# Patient Record
Sex: Male | Born: 1985 | Race: White | Hispanic: No | Marital: Single | Smoking: Current every day smoker
Health system: Southern US, Community
[De-identification: ages and names within clinical notes are randomized; demographics above are authoritative.]

## PROBLEM LIST (undated history)

## (undated) DIAGNOSIS — I10 Essential (primary) hypertension: Secondary | ICD-10-CM

## (undated) DIAGNOSIS — E119 Type 2 diabetes mellitus without complications: Secondary | ICD-10-CM

## (undated) DIAGNOSIS — G629 Polyneuropathy, unspecified: Secondary | ICD-10-CM

## (undated) DIAGNOSIS — F419 Anxiety disorder, unspecified: Secondary | ICD-10-CM

## (undated) DIAGNOSIS — M549 Dorsalgia, unspecified: Secondary | ICD-10-CM

## (undated) HISTORY — DX: Essential (primary) hypertension: I10

## (undated) HISTORY — DX: Dorsalgia, unspecified: M54.9

## (undated) HISTORY — DX: Type 2 diabetes mellitus without complications: E11.9

## (undated) HISTORY — PX: SHOULDER SURGERY: SHX246

## (undated) HISTORY — DX: Polyneuropathy, unspecified: G62.9

## (undated) HISTORY — DX: Anxiety disorder, unspecified: F41.9

---

## 2008-02-13 ENCOUNTER — Emergency Department (HOSPITAL_COMMUNITY): Admission: EM | Admit: 2008-02-13 | Discharge: 2008-02-13 | Payer: Self-pay | Admitting: Emergency Medicine

## 2009-05-17 ENCOUNTER — Emergency Department (HOSPITAL_COMMUNITY): Admission: EM | Admit: 2009-05-17 | Discharge: 2009-05-17 | Payer: Self-pay | Admitting: Emergency Medicine

## 2010-06-03 LAB — MONONUCLEOSIS SCREEN: Mono Screen: NEGATIVE

## 2010-06-03 LAB — DIFFERENTIAL
Basophils Absolute: 0.1 10*3/uL (ref 0.0–0.1)
Eosinophils Relative: 1 % (ref 0–5)
Lymphocytes Relative: 14 % (ref 12–46)
Lymphs Abs: 2.1 10*3/uL (ref 0.7–4.0)
Neutro Abs: 11.4 10*3/uL — ABNORMAL HIGH (ref 1.7–7.7)
Neutrophils Relative %: 78 % — ABNORMAL HIGH (ref 43–77)

## 2010-06-03 LAB — CBC
HCT: 39.5 % (ref 39.0–52.0)
Platelets: 278 10*3/uL (ref 150–400)
RDW: 13.2 % (ref 11.5–15.5)
WBC: 14.6 10*3/uL — ABNORMAL HIGH (ref 4.0–10.5)

## 2010-06-03 LAB — RAPID STREP SCREEN (MED CTR MEBANE ONLY): Streptococcus, Group A Screen (Direct): NEGATIVE

## 2010-12-13 LAB — DIFFERENTIAL
Basophils Absolute: 0.1 10*3/uL (ref 0.0–0.1)
Basophils Relative: 1 % (ref 0–1)
Eosinophils Absolute: 0.2 10*3/uL (ref 0.0–0.7)
Monocytes Absolute: 0.6 10*3/uL (ref 0.1–1.0)
Monocytes Relative: 6 % (ref 3–12)
Neutrophils Relative %: 61 % (ref 43–77)

## 2010-12-13 LAB — URINALYSIS, ROUTINE W REFLEX MICROSCOPIC
Bilirubin Urine: NEGATIVE
Hgb urine dipstick: NEGATIVE
Nitrite: NEGATIVE
Protein, ur: NEGATIVE mg/dL
Urobilinogen, UA: 0.2 mg/dL (ref 0.0–1.0)

## 2010-12-13 LAB — COMPREHENSIVE METABOLIC PANEL
ALT: 21 U/L (ref 0–53)
Alkaline Phosphatase: 61 U/L (ref 39–117)
BUN: 9 mg/dL (ref 6–23)
Chloride: 108 mEq/L (ref 96–112)
Glucose, Bld: 99 mg/dL (ref 70–99)
Potassium: 4.4 mEq/L (ref 3.5–5.1)
Sodium: 139 mEq/L (ref 135–145)
Total Bilirubin: 1.6 mg/dL — ABNORMAL HIGH (ref 0.3–1.2)
Total Protein: 7 g/dL (ref 6.0–8.3)

## 2010-12-13 LAB — CBC
HCT: 40.5 % (ref 39.0–52.0)
Hemoglobin: 13.8 g/dL (ref 13.0–17.0)
RBC: 4.6 MIL/uL (ref 4.22–5.81)
RDW: 13.1 % (ref 11.5–15.5)
WBC: 9.7 10*3/uL (ref 4.0–10.5)

## 2019-11-14 ENCOUNTER — Emergency Department (HOSPITAL_COMMUNITY)
Admission: EM | Admit: 2019-11-14 | Discharge: 2019-11-14 | Disposition: A | Discharge status: Home / Self Care | Payer: Worker's Compensation | Attending: Emergency Medicine | Admitting: Emergency Medicine

## 2019-11-14 ENCOUNTER — Other Ambulatory Visit: Payer: Self-pay

## 2019-11-14 ENCOUNTER — Emergency Department (HOSPITAL_COMMUNITY): Payer: Worker's Compensation

## 2019-11-14 ENCOUNTER — Encounter (HOSPITAL_COMMUNITY): Payer: Self-pay | Admitting: Emergency Medicine

## 2019-11-14 DIAGNOSIS — F17200 Nicotine dependence, unspecified, uncomplicated: Secondary | ICD-10-CM | POA: Insufficient documentation

## 2019-11-14 DIAGNOSIS — Y9389 Activity, other specified: Secondary | ICD-10-CM | POA: Diagnosis not present

## 2019-11-14 DIAGNOSIS — Y929 Unspecified place or not applicable: Secondary | ICD-10-CM | POA: Insufficient documentation

## 2019-11-14 DIAGNOSIS — S39012A Strain of muscle, fascia and tendon of lower back, initial encounter: Secondary | ICD-10-CM | POA: Diagnosis not present

## 2019-11-14 DIAGNOSIS — X500XXA Overexertion from strenuous movement or load, initial encounter: Secondary | ICD-10-CM | POA: Insufficient documentation

## 2019-11-14 DIAGNOSIS — S3992XA Unspecified injury of lower back, initial encounter: Secondary | ICD-10-CM | POA: Diagnosis present

## 2019-11-14 DIAGNOSIS — Y999 Unspecified external cause status: Secondary | ICD-10-CM | POA: Diagnosis not present

## 2019-11-14 MED ORDER — TRAMADOL HCL 50 MG PO TABS: 50.0000 mg | ORAL_TABLET | Freq: Four times a day (QID) | ORAL | 0 refills | Status: DC | PRN
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
## 2019-11-14 NOTE — ED Triage Notes (Signed)
Pt states he had a work injury on Wednesday causing pain in his back. Pt was seen at urgent care and given muscle relaxer's but pain has continued to get worse.

## 2019-11-14 NOTE — Discharge Instructions (Addendum)
The x-ray of your back today was reassuring.  You likely have strained the muscles of your lower back.  Try alternating ice and heat to this area of your back when possible.  Continue taking your anti-inflammatory and muscle relaxers as prescribed.  You may take the tramadol when needed for severe pain.  Follow-up with urgent care or with the orthopedic provider listed in 2 weeks if your back pain is not improving.

## 2019-11-14 NOTE — ED Notes (Signed)
Pt gone to xray
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
## 2022-04-18 IMAGING — DX DG LUMBAR SPINE COMPLETE 4+V
5 series · 5 of 5 positions shown · non-contrast
Comparison: None.

CLINICAL DATA: Work injury.  Back pain mainly on the left.

EXAM:
LUMBAR SPINE - COMPLETE 4+ VIEW

[l-spine ap]
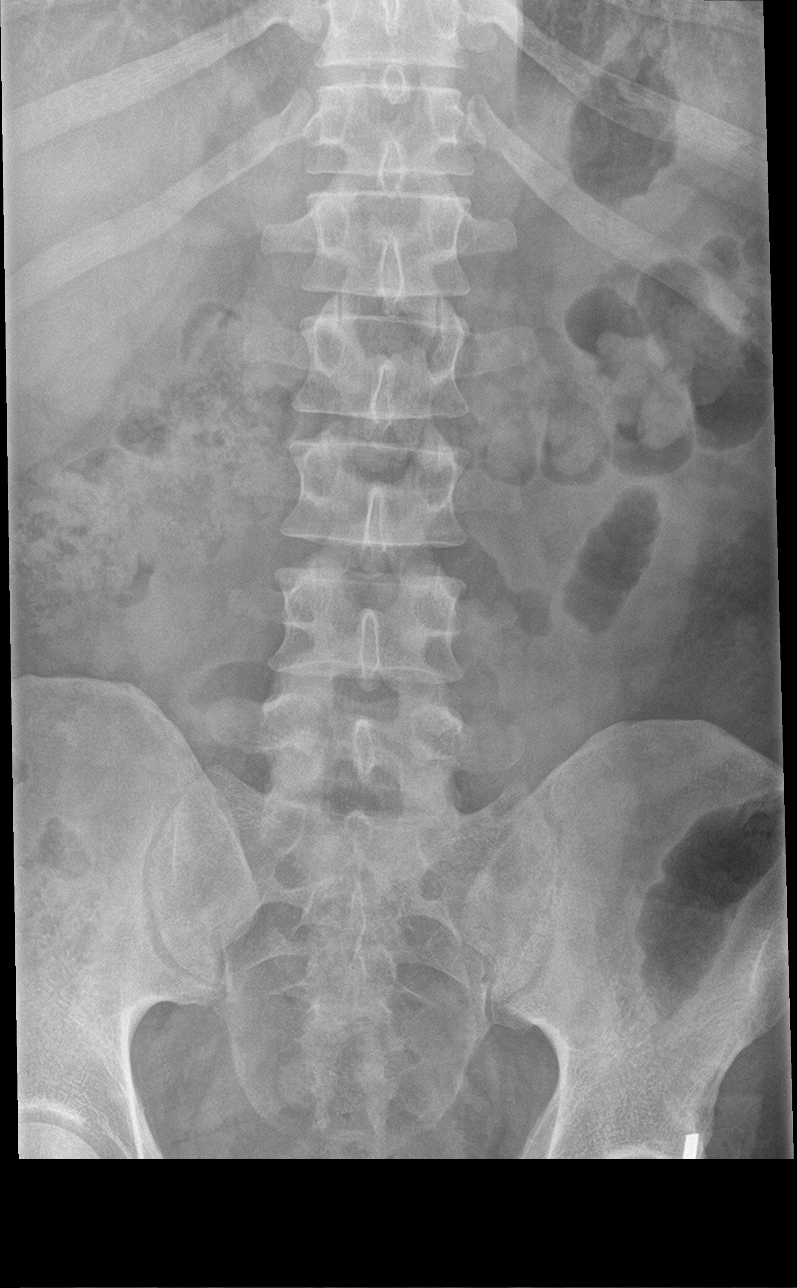

[l-spine obl (1 of 2)]
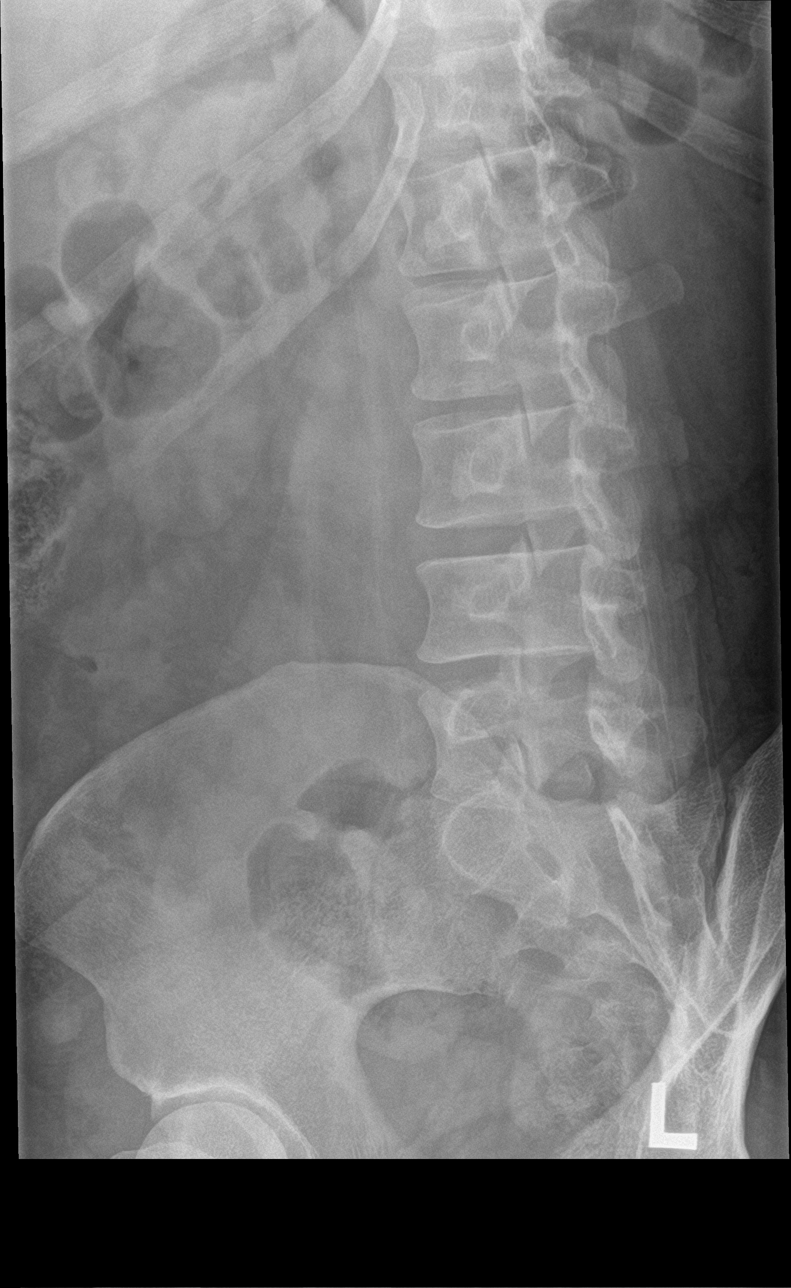

[l-spine obl (2 of 2)]
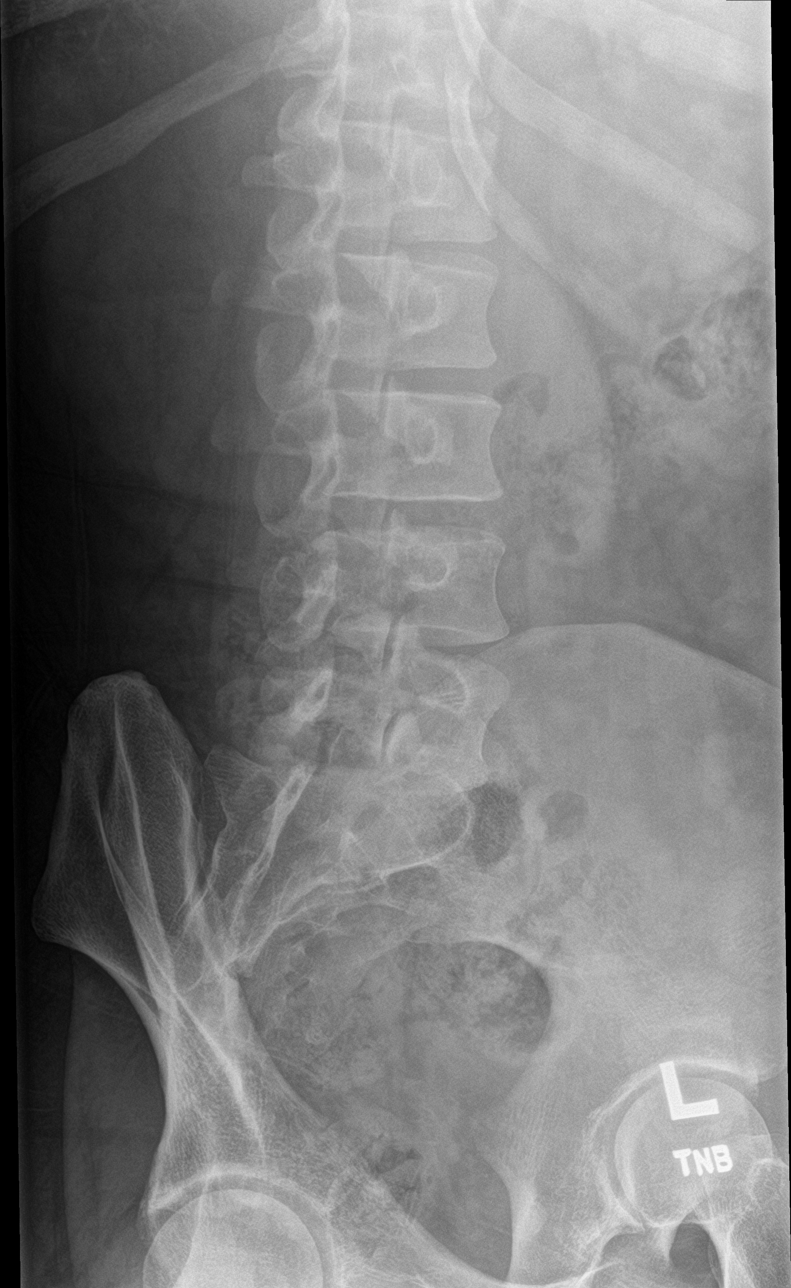

[l-spine lat]
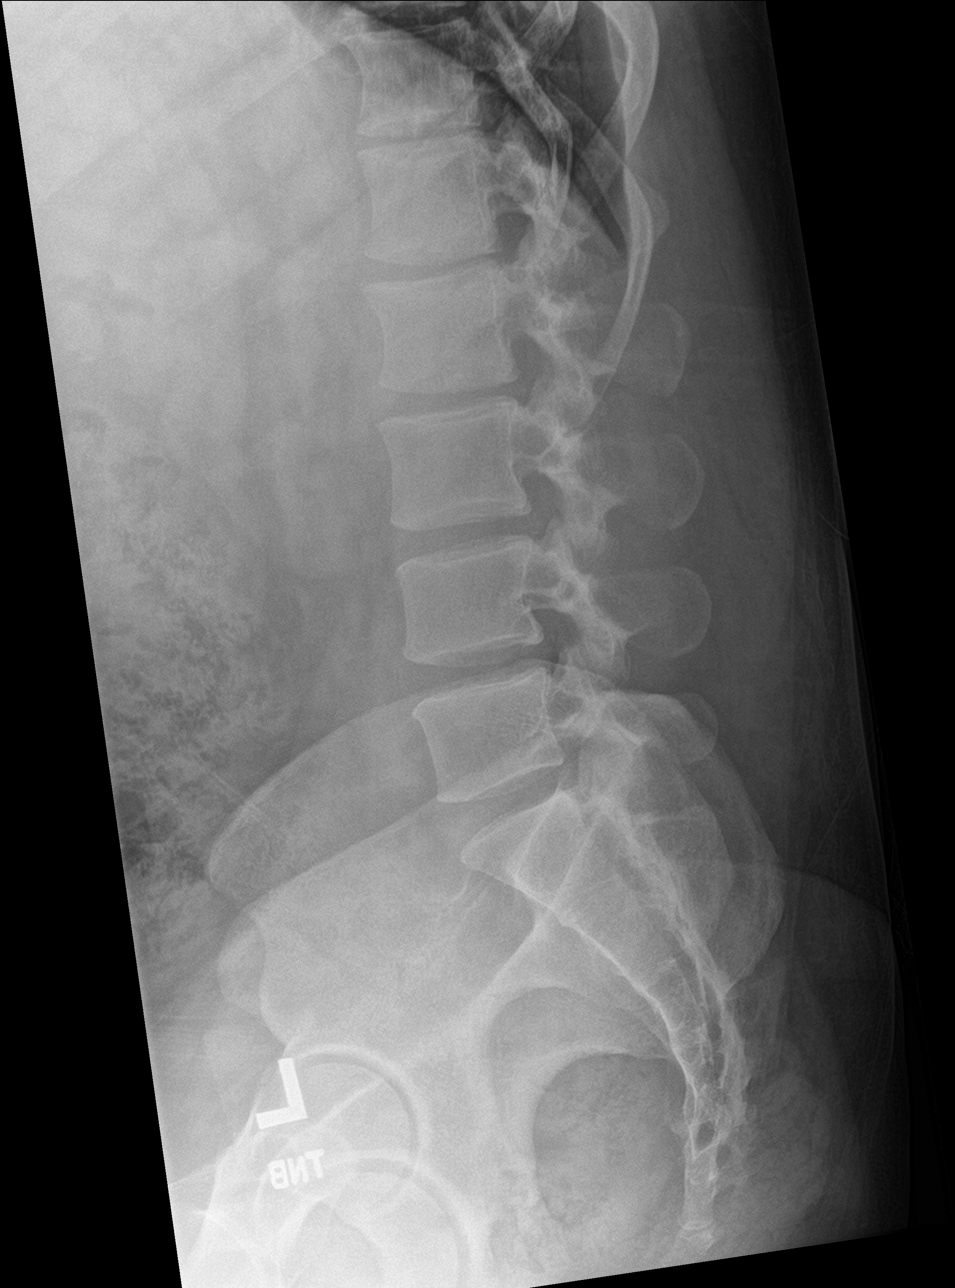

[l-spine spot]
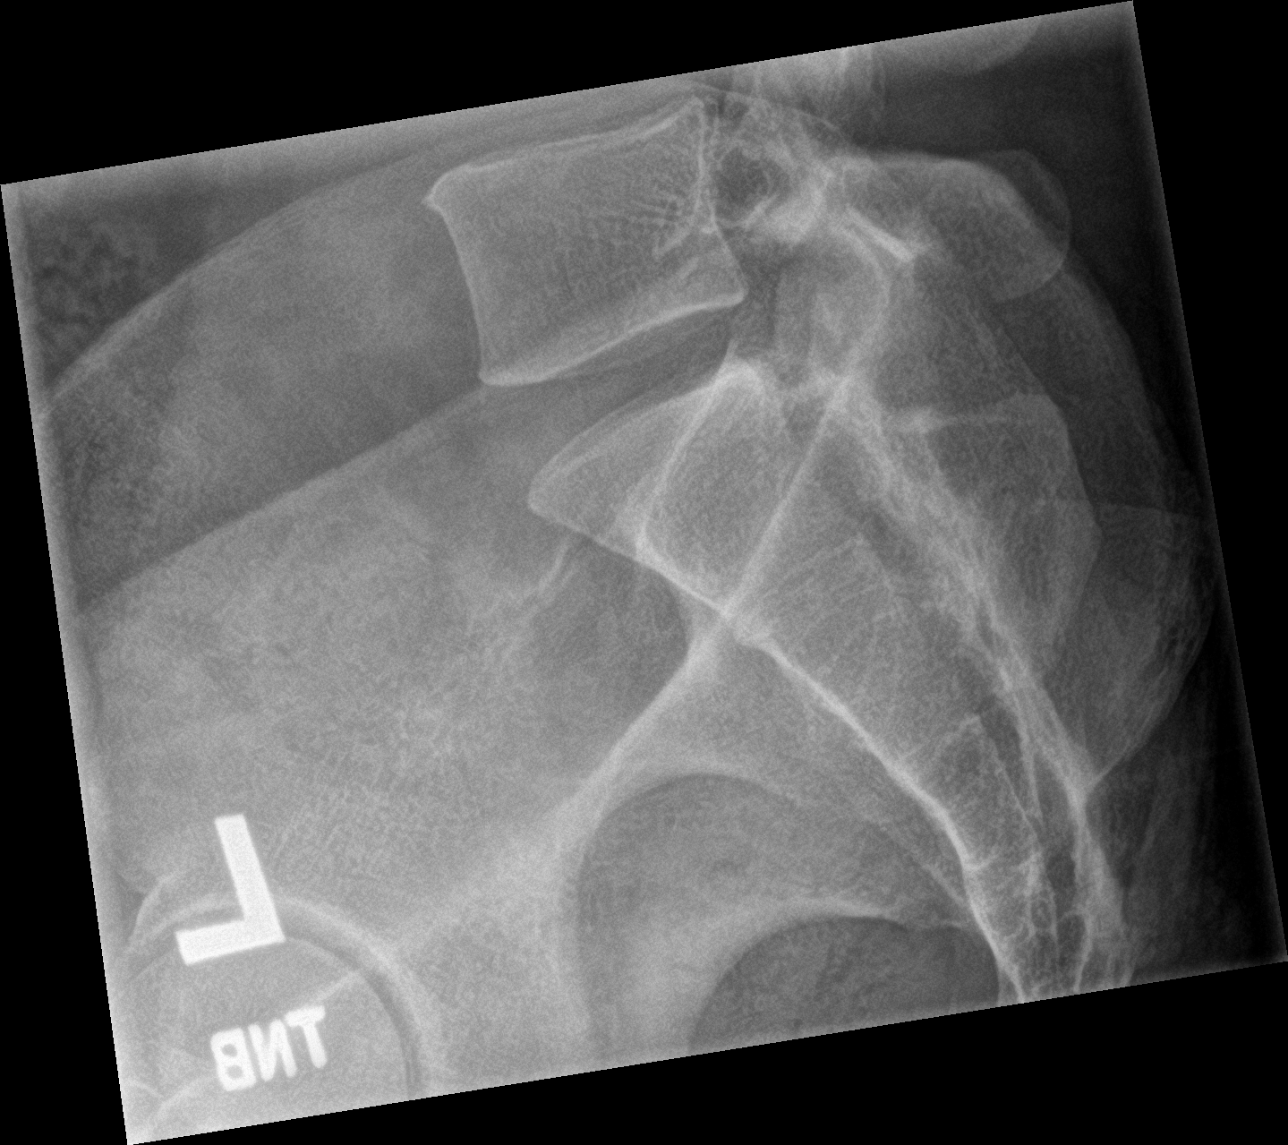

[5 of 5 positions shown; findings below may reference images not displayed]

FINDINGS: There is no evidence of lumbar spine fracture. Negative for
listhesis. Intervertebral disc spaces are maintained.
IMPRESSION: Negative.

## 2023-09-02 ENCOUNTER — Encounter: Payer: Self-pay | Admitting: Gastroenterology

## 2023-09-15 ENCOUNTER — Telehealth: Payer: Self-pay | Admitting: *Deleted

## 2023-09-15 ENCOUNTER — Encounter: Payer: Self-pay | Admitting: Gastroenterology

## 2023-09-15 ENCOUNTER — Ambulatory Visit (INDEPENDENT_AMBULATORY_CARE_PROVIDER_SITE_OTHER): Payer: PRIVATE HEALTH INSURANCE | Admitting: Gastroenterology

## 2023-09-15 VITALS — BP 136/91 | HR 66 | Temp 98.5°F | Ht 63.0 in | Wt 225.4 lb

## 2023-09-15 DIAGNOSIS — K625 Hemorrhage of anus and rectum: Secondary | ICD-10-CM | POA: Insufficient documentation

## 2023-09-15 DIAGNOSIS — D649 Anemia, unspecified: Secondary | ICD-10-CM | POA: Diagnosis not present

## 2023-09-15 NOTE — Patient Instructions (Signed)
 Please complete labs as soon as you can at [facility]. You could do today while in town. [facility] is located at [address] [place].  Colonoscopy with possible upper endoscopy to evaluate your anemia.   If you are consistently having hard stools, please add Miralax one capful daily. This can be purchased over the counter. You can buy generic store brand which would be much cheaper.

## 2023-09-15 NOTE — Telephone Encounter (Signed)
 Called pt, no answer and VM full. Need to to schedule TCS +/- EGD WITH Dr. Cindie, ASA 2

## 2023-09-15 NOTE — Progress Notes (Unsigned)
 GI Office Note    Referring Provider: Trudy Vaughn FALCON, MD Primary Care Physician:  Patient, No Pcp Per  Primary Gastroenterologist: Carlin POUR. Cindie, DO   Chief Complaint   Chief Complaint  Patient presents with   New Patient (Initial Visit)    Pt referred for hard stools, anemia. Pt here for colonoscopy     History of Present Illness   Bradley Li is a 38 y.o. male presenting today at the request of Dr. Trudy for new onset anemia with Hgb of 11. Hgb 14 one year ago. Reports blood in stools couple of times.   Patient states he has had some fresh blood per rectum at times, nothing on regular basis. Often noted if he has loose stools after eating spicy food or if he has hard stool and has to strain. Generally no abdominal pain. Chronically his stools are all over the place, has at least one daily but could be hard to loose. May have few stools a day if eats something spicy. No heartburn, vomiting, heartburn, dysphagia.   Random nosebleed. Remote history of intranasal drug use but none since around 2016. Had oxycontin addiction. No recent blood donations. States he has been screened for hepatitis B/C by his PCP.   Used to take tons of NSAIDS when he was injury at work several years back. Would take ibuprofen 1000mg  2-3 times daily. Limited NSAID use now. No ASA powders.   No known FH of CRC, celiac, IBD. Notes he does not know his father's medical history.     08/2023: Hgb 11.9, Hct 34.5, MCV 84.3, Plt 234, Na 138, K 4.1, Glu 142, Cre 1.17, Tbili 0.6, AP 41, AST 11, ALT 14, Alb 4.2  Medications   Current Outpatient Medications  Medication Sig Dispense Refill   buPROPion (ZYBAN) 150 MG 12 hr tablet Take 150 mg by mouth 2 (two) times daily.     calcium carbonate (OS-CAL - DOSED IN MG OF ELEMENTAL CALCIUM) 1250 (500 Ca) MG tablet Take 1 tablet by mouth daily with breakfast.     gabapentin (NEURONTIN) 400 MG capsule Take 400 mg by mouth 2 (two) times daily.      glipiZIDE (GLUCOTROL XL) 2.5 MG 24 hr tablet Take 2.5 mg by mouth daily with breakfast.     losartan (COZAAR) 25 MG tablet Take 25 mg by mouth daily.     methocarbamol (ROBAXIN) 500 MG tablet Take 500 mg by mouth daily. As needed     rosuvastatin (CRESTOR) 10 MG tablet Take 10 mg by mouth daily.     No current facility-administered medications for this visit.    Allergies   Allergies as of 09/15/2023   (No Known Allergies)    Past Medical History   Past Medical History:  Diagnosis Date   Anxiety/depression    Back pain    DM (diabetes mellitus) (HCC)    HTN (hypertension)    Neuropathy     Past Surgical History   Past Surgical History:  Procedure Laterality Date   SHOULDER SURGERY Left    X2    Past Family History   Family History  Problem Relation Age of Onset   Colon cancer Neg Hx    Celiac disease Neg Hx    Inflammatory bowel disease Neg Hx     Past Social History   Social History   Socioeconomic History   Marital status: Single    Spouse name: Not on file   Number of children: Not  on file   Years of education: Not on file   Highest education level: Not on file  Occupational History   Not on file  Tobacco Use   Smoking status: Every Day   Smokeless tobacco: Never  Substance and Sexual Activity   Alcohol use: Not on file    Comment: everclear, couple of shots. one day a week. no prior heavy etoh use   Drug use: Not Currently    Comment: none 8-9, intranasal   Sexual activity: Not on file  Other Topics Concern   Not on file  Social History Narrative   Not on file   Social Drivers of Health   Financial Resource Strain: Not on file  Food Insecurity: Not on file  Transportation Needs: Not on file  Physical Activity: Not on file  Stress: Not on file  Social Connections: Unknown (07/23/2021)   Received from [facility]   Social Network    Social Network: Not on file  Intimate Partner Violence: Unknown (06/14/2021)   Received from [facility]   HITS    Physically Hurt: Not on file    Insult or Talk Down To: Not on file    Threaten Physical Harm: Not on file    Scream or Curse: Not on file    Review of Systems   General: Negative for anorexia, weight loss, fever, chills, fatigue, weakness. Eyes: Negative for vision changes.  ENT: Negative for hoarseness, difficulty swallowing, nasal congestion. Poor dental health CV: Negative for chest pain, angina, palpitations, dyspnea on exertion, peripheral edema.  Respiratory: Negative for dyspnea at rest, dyspnea on exertion, cough, sputum, wheezing.  GI: See history of present illness. GU:  Negative for dysuria, hematuria, urinary incontinence, urinary frequency, nocturnal urination.  MS: +peripheral neuropathy, low back pain.  Derm: Negative for rash or itching.  Neuro: Negative for weakness, abnormal sensation, seizure, frequent headaches, memory loss,  confusion.  Psych: Negative for anxiety, depression, suicidal ideation, hallucinations.  Endo: Negative for unusual weight change.  Heme: Negative for bruising or bleeding. Allergy: Negative for rash or hives.  Physical Exam   BP (!) 136/91   Pulse 66   Temp 98.5 F (36.9 C)   Ht 5' 3 (1.6 m)   Wt 225 lb 6.4 oz (102.2 kg)   BMI 39.93 kg/m    General: Well-nourished, well-developed in no acute distress.  Head: Normocephalic, atraumatic.   Eyes: Conjunctiva pink, no icterus. Mouth: Oropharyngeal mucosa moist and pink. Poor dentition Neck: Supple  Lungs: Clear to auscultation bilaterally.  Heart: Regular rate and rhythm, no murmurs rubs or gallops.  Abdomen: Bowel sounds are normal, nontender, nondistended, no hepatosplenomegaly or masses,  no abdominal bruits or hernia, no rebound or guarding.   Rectal: not performed Extremities: No lower extremity edema. No clubbing or deformities.  Neuro: Alert and oriented x 4 , grossly normal neurologically.  Skin: Warm and dry, no rash or jaundice.   Psych: Alert and  cooperative, normal mood and affect.  Labs   See hpi  Imaging Studies   No results found.  Assessment/Plan:   Anemia/rectal bleeding: -noted new normocytic anemia in past one year, iron levels not known -chronic bowel issues with stools ranging from hard to loose -occasional brbpr -previous heavy chronic nsaid use, but occasional use at this time -check anemia labs, ttg iga, iga -colonoscopy with possible egd. ASA 2.  I have discussed the risks, alternatives, benefits with regards to but not limited to the risk of reaction to medication, bleeding,  infection, perforation and the patient is agreeable to proceed. Written consent to be obtained. -if consistently having hard stools, consider adding miralax one capful daily to soft stool   Sonny RAMAN. Ezzard, MHS, PA-C [facility]

## 2023-09-24 LAB — CBC WITH DIFFERENTIAL/PLATELET
Basophils Absolute: 0.1 x10E3/uL (ref 0.0–0.2)
Basos: 1 %
EOS (ABSOLUTE): 0.2 x10E3/uL (ref 0.0–0.4)
Eos: 3 %
Hematocrit: 39.7 % (ref 37.5–51.0)
Hemoglobin: 13.4 g/dL (ref 13.0–17.7)
Immature Grans (Abs): 0 x10E3/uL (ref 0.0–0.1)
Immature Granulocytes: 0 %
Lymphocytes Absolute: 2.1 x10E3/uL (ref 0.7–3.1)
Lymphs: 30 %
MCH: 30.2 pg (ref 26.6–33.0)
MCHC: 33.8 g/dL (ref 31.5–35.7)
MCV: 89 fL (ref 79–97)
Monocytes Absolute: 0.4 x10E3/uL (ref 0.1–0.9)
Monocytes: 6 %
Neutrophils Absolute: 4.3 x10E3/uL (ref 1.4–7.0)
Neutrophils: 60 %
Platelets: 237 x10E3/uL (ref 150–450)
RBC: 4.44 x10E6/uL (ref 4.14–5.80)
RDW: 13.1 % (ref 11.6–15.4)
WBC: 7 x10E3/uL (ref 3.4–10.8)

## 2023-09-24 LAB — IGA: IgA/Immunoglobulin A, Serum: 117 mg/dL (ref 90–386)

## 2023-09-24 LAB — IRON,TIBC AND FERRITIN PANEL
Ferritin: 300 ng/mL (ref 30–400)
Iron Saturation: 30 % (ref 15–55)
Iron: 106 ug/dL (ref 38–169)
Total Iron Binding Capacity: 355 ug/dL (ref 250–450)
UIBC: 249 ug/dL (ref 111–343)

## 2023-09-24 LAB — B12 AND FOLATE PANEL
Folate: 8.3 ng/mL (ref >3.0)
Vitamin B-12: 883 pg/mL (ref 232–1245)

## 2023-09-24 LAB — TISSUE TRANSGLUTAMINASE, IGA: Transglutaminase IgA: <2 U/mL (ref 0–3)

## 2023-09-27 ENCOUNTER — Ambulatory Visit: Payer: Self-pay | Admitting: Gastroenterology

## 2023-09-30 NOTE — Telephone Encounter (Signed)
 Called pt, no answer and VM still full. Letter mailed.

## 2023-10-19 NOTE — Telephone Encounter (Signed)
 Pt left VM taht he received letter we sent to him. Reports he can be reached on Tuesday as that is his only day off. Any other time is hard to reach him.

## 2023-10-20 MED ORDER — PEG 3350-KCL-NA BICARB-NACL 420 G PO SOLR: 4000.0000 mL | Freq: Once | ORAL | 0 refills | Status: AC

## 2023-10-20 NOTE — Telephone Encounter (Signed)
 Spoke with pt. Scheduled for 9/9 with Dr. Cindie. Aware will mail instructions to him and send rx for prep to his pharmacy

## 2023-10-20 NOTE — Addendum Note (Signed)
 Addended by: JEANELL GRAEME RAMAN on: 10/20/2023 08:53 AM   Modules accepted: Orders

## 2023-11-17 ENCOUNTER — Encounter (HOSPITAL_COMMUNITY)
Admission: RE | Disposition: A | Discharge status: Home / Self Care | Payer: Self-pay | Source: Home / Self Care | Attending: Internal Medicine

## 2023-11-17 ENCOUNTER — Ambulatory Visit (HOSPITAL_COMMUNITY): Payer: PRIVATE HEALTH INSURANCE

## 2023-11-17 ENCOUNTER — Other Ambulatory Visit: Payer: Self-pay

## 2023-11-17 ENCOUNTER — Ambulatory Visit (HOSPITAL_COMMUNITY)
Admission: RE | Admit: 2023-11-17 | Discharge: 2023-11-17 | Disposition: A | Discharge status: Home / Self Care | Payer: PRIVATE HEALTH INSURANCE | Attending: Internal Medicine | Admitting: Internal Medicine

## 2023-11-17 ENCOUNTER — Ambulatory Visit (HOSPITAL_BASED_OUTPATIENT_CLINIC_OR_DEPARTMENT_OTHER): Payer: PRIVATE HEALTH INSURANCE

## 2023-11-17 ENCOUNTER — Encounter (HOSPITAL_COMMUNITY): Payer: Self-pay | Admitting: Internal Medicine

## 2023-11-17 DIAGNOSIS — K625 Hemorrhage of anus and rectum: Secondary | ICD-10-CM | POA: Insufficient documentation

## 2023-11-17 DIAGNOSIS — Z7984 Long term (current) use of oral hypoglycemic drugs: Secondary | ICD-10-CM | POA: Insufficient documentation

## 2023-11-17 DIAGNOSIS — K648 Other hemorrhoids: Secondary | ICD-10-CM | POA: Insufficient documentation

## 2023-11-17 DIAGNOSIS — D649 Anemia, unspecified: Secondary | ICD-10-CM | POA: Diagnosis not present

## 2023-11-17 DIAGNOSIS — K259 Gastric ulcer, unspecified as acute or chronic, without hemorrhage or perforation: Secondary | ICD-10-CM

## 2023-11-17 DIAGNOSIS — I10 Essential (primary) hypertension: Secondary | ICD-10-CM | POA: Insufficient documentation

## 2023-11-17 DIAGNOSIS — F419 Anxiety disorder, unspecified: Secondary | ICD-10-CM | POA: Diagnosis not present

## 2023-11-17 DIAGNOSIS — K2970 Gastritis, unspecified, without bleeding: Secondary | ICD-10-CM | POA: Insufficient documentation

## 2023-11-17 DIAGNOSIS — K2980 Duodenitis without bleeding: Secondary | ICD-10-CM | POA: Diagnosis not present

## 2023-11-17 DIAGNOSIS — F17210 Nicotine dependence, cigarettes, uncomplicated: Secondary | ICD-10-CM | POA: Diagnosis not present

## 2023-11-17 DIAGNOSIS — E119 Type 2 diabetes mellitus without complications: Secondary | ICD-10-CM | POA: Insufficient documentation

## 2023-11-17 HISTORY — PX: COLONOSCOPY: SHX5424

## 2023-11-17 HISTORY — PX: ESOPHAGOGASTRODUODENOSCOPY: SHX5428

## 2023-11-17 LAB — GLUCOSE, CAPILLARY: Glucose-Capillary: 94 mg/dL (ref 70–99)

## 2023-11-17 SURGERY — COLONOSCOPY
Anesthesia: General

## 2023-11-17 MED ORDER — LIDOCAINE 2% (20 MG/ML) 5 ML SYRINGE
INTRAMUSCULAR | Status: DC | PRN
  Administered 2023-11-17: 100 mg via INTRAVENOUS

## 2023-11-17 MED ORDER — LACTATED RINGERS IV SOLN: INTRAVENOUS | Status: DC | PRN

## 2023-11-17 MED ORDER — LACTATED RINGERS IV SOLN: INTRAVENOUS | Status: DC

## 2023-11-17 MED ORDER — PROPOFOL 500 MG/50ML IV EMUL
INTRAVENOUS | Status: DC | PRN
  Administered 2023-11-17: 100 ug/kg/min via INTRAVENOUS

## 2023-11-17 MED ORDER — PANTOPRAZOLE SODIUM 40 MG PO TBEC
40.0000 mg | DELAYED_RELEASE_TABLET | Freq: Every day | ORAL | 11 refills | Status: AC
Start: 2023-11-17 — End: 2024-11-16

## 2023-11-17 NOTE — Transfer of Care (Signed)
 Immediate Anesthesia Transfer of Care Note  Patient: Bradley Li  Procedure(s) Performed: COLONOSCOPY EGD (ESOPHAGOGASTRODUODENOSCOPY)  Patient Location: Endoscopy Unit  Anesthesia Type:General  Level of Consciousness: awake  Airway & Oxygen Therapy: Patient Spontanous Breathing  Post-op Assessment: Report given to RN and Post -op Vital signs reviewed and stable  Post vital signs: Reviewed and stable  Last Vitals:  Vitals Value Taken Time  BP 90/58 11/17/23 09:00  Temp 36.5 C 11/17/23 09:00  Pulse 63 11/17/23 09:00  Resp 20 11/17/23 09:00  SpO2 96 % 11/17/23 09:00    Last Pain:  Vitals:   11/17/23 0900  TempSrc: Oral  PainSc:       Patients Stated Pain Goal: 3 (11/17/23 0737)  Complications: No notable events documented.

## 2023-11-17 NOTE — Op Note (Signed)
 [facility] Patient Name: Bradley Li Procedure Date: 11/17/2023 8:24 AM MRN: [id number] Date of Birth: May 11, 1985 Attending MD: Carlin POUR. Cindie , [place], [id number] CSN: [id number] Age: 38 Admit Type: Outpatient Procedure:                Colonoscopy Indications:              Rectal bleeding, Anemia Providers:                Carlin POUR. Cindie, DO, Crystal Page, Jon Loge Referring MD:              Medicines:                See the Anesthesia note for documentation of the                            administered medications Complications:            No immediate complications. Estimated Blood Loss:     Estimated blood loss: none. Procedure:                Pre-Anesthesia Assessment:                           - The anesthesia plan was to use monitored                            anesthesia care (MAC).                           After obtaining informed consent, the colonoscope                            was passed under direct vision. Throughout the                            procedure, the patient's blood pressure, pulse, and                            oxygen saturations were monitored continuously. The                            [id number]-HQ190L ([id number]) Peds Colon was introduced                            through the anus and advanced to the the terminal                            ileum, with identification of the appendiceal                            orifice and IC valve. The colonoscopy was performed                            without difficulty. The patient tolerated the  procedure well. The quality of the bowel                            preparation was evaluated using the BBPS [facility]                            Bowel Preparation Scale) with scores of: Right                            Colon = 3, Transverse Colon = 3 and Left Colon = 3                            (entire mucosa seen well with no residual staining,                             small fragments of stool or opaque liquid). The                            total BBPS score equals 9. Scope In: 8:48:25 AM Scope Out: 8:58:25 AM Scope Withdrawal Time: 0 hours 8 minutes 35 seconds  Total Procedure Duration: 0 hours 10 minutes 0 seconds  Findings:      Non-bleeding internal hemorrhoids were found during retroflexion.      The terminal ileum appeared normal.      The exam was otherwise without abnormality. Impression:               - Non-bleeding internal hemorrhoids.                           - The examination was otherwise normal.                           - No specimens collected. Moderate Sedation:      Per Anesthesia Care Recommendation:           - Patient has a contact number available for                            emergencies. The signs and symptoms of potential                            delayed complications were discussed with the                            patient. Return to normal activities tomorrow.                            Written discharge instructions were provided to the                            patient.                           - Resume previous diet.                           -  Continue present medications.                           - Repeat colonoscopy in 10 years for screening                            purposes.                           - Return to GI clinic in 3 months. Procedure Code(s):        --- Professional ---                           [phone], Colonoscopy, flexible; diagnostic, including                            collection of specimen(s) by brushing or washing,                            when performed (separate procedure) Diagnosis Code(s):        --- Professional ---                           K64.8, Other hemorrhoids                           K62.5, Hemorrhage of anus and rectum CPT copyright 2022 American Medical Association. All rights reserved. The codes documented in this report are preliminary and upon  coder review may  be revised to meet current compliance requirements. Carlin POUR. Cindie, DO Carlin POUR. Aranda Bihm, DO 11/17/2023 9:03:03 AM This report has been signed electronically. Number of Addenda: 0

## 2023-11-17 NOTE — Anesthesia Postprocedure Evaluation (Signed)
 Anesthesia Post Note  Patient: Bradley Li  Procedure(s) Performed: COLONOSCOPY EGD (ESOPHAGOGASTRODUODENOSCOPY)  Patient location during evaluation: PACU Anesthesia Type: General Level of consciousness: awake and alert Pain management: pain level controlled Vital Signs Assessment: post-procedure vital signs reviewed and stable Respiratory status: spontaneous breathing, nonlabored ventilation, respiratory function stable and patient connected to nasal cannula oxygen Cardiovascular status: blood pressure returned to baseline and stable Postop Assessment: no apparent nausea or vomiting Anesthetic complications: no   No notable events documented.   Last Vitals:  Vitals:   11/17/23 0906 11/17/23 0912  BP: (!) 88/53 115/69  Pulse: 66 69  Resp: 15 13  Temp:    SpO2: 96% 99%    Last Pain:  Vitals:   11/17/23 0900  TempSrc: Oral  PainSc:                  Andrea Limes

## 2023-11-17 NOTE — H&P (Signed)
 Primary Care Physician:  Trudy Vaughn FALCON, MD Primary Gastroenterologist:  Dr. Cindie  Pre-Procedure History & Physical: HPI:  Bradley Li is a 38 y.o. male is here for an EGD and colonoscopy to be performed for anemia, rectal bleeding  Past Medical History:  Diagnosis Date   Anxiety/depression    Back pain    DM (diabetes mellitus) (HCC)    HTN (hypertension)    Neuropathy     Past Surgical History:  Procedure Laterality Date   SHOULDER SURGERY Left    X2    Prior to Admission medications   Medication Sig Start Date End Date Taking? Authorizing Provider  buPROPion (ZYBAN) 150 MG 12 hr tablet Take 150 mg by mouth 2 (two) times daily.   Yes [provider]  calcium carbonate (OS-CAL - DOSED IN MG OF ELEMENTAL CALCIUM) 1250 (500 Ca) MG tablet Take 1 tablet by mouth daily with breakfast.   Yes [provider]  gabapentin (NEURONTIN) 400 MG capsule Take 400 mg by mouth 2 (two) times daily.   Yes [provider]  glipiZIDE (GLUCOTROL XL) 2.5 MG 24 hr tablet Take 2.5 mg by mouth daily with breakfast.   Yes [provider]  losartan (COZAAR) 25 MG tablet Take 25 mg by mouth daily.   Yes [provider]  methocarbamol (ROBAXIN) 500 MG tablet Take 500 mg by mouth daily. As needed   Yes [provider]  rosuvastatin (CRESTOR) 10 MG tablet Take 10 mg by mouth daily.   Yes [provider]    Allergies as of 10/20/2023   (No Known Allergies)    Family History  Problem Relation Age of Onset   Colon cancer Neg Hx    Celiac disease Neg Hx    Inflammatory bowel disease Neg Hx     Social History   Socioeconomic History   Marital status: Single    Spouse name: Not on file   Number of children: Not on file   Years of education: Not on file   Highest education level: Not on file  Occupational History   Not on file  Tobacco Use   Smoking status: Every Day   Smokeless tobacco: Never  Vaping Use   Vaping status:  Never Used  Substance and Sexual Activity   Alcohol use: Not on file    Comment: everclear, couple of shots. one day a week. no prior heavy etoh use   Drug use: Not Currently    Comment: none since aroun 2016, intranasal   Sexual activity: Not on file  Other Topics Concern   Not on file  Social History Narrative   Not on file   Social Drivers of Health   Financial Resource Strain: Not on file  Food Insecurity: Not on file  Transportation Needs: Not on file  Physical Activity: Not on file  Stress: Not on file  Social Connections: Unknown (07/23/2021)   Received from [facility]   Social Network    Social Network: Not on file  Intimate Partner Violence: Unknown (06/14/2021)   Received from [facility]   HITS    Physically Hurt: Not on file    Insult or Talk Down To: Not on file    Threaten Physical Harm: Not on file    Scream or Curse: Not on file    Review of Systems: General: Negative for fever, chills, fatigue, weakness. Eyes: Negative for vision changes.  ENT: Negative for hoarseness, difficulty swallowing , nasal congestion. CV: Negative for chest  pain, angina, palpitations, dyspnea on exertion, peripheral edema.  Respiratory: Negative for dyspnea at rest, dyspnea on exertion, cough, sputum, wheezing.  GI: See history of present illness. GU:  Negative for dysuria, hematuria, urinary incontinence, urinary frequency, nocturnal urination.  MS: Negative for joint pain, low back pain.  Derm: Negative for rash or itching.  Neuro: Negative for weakness, abnormal sensation, seizure, frequent headaches, memory loss, confusion.  Psych: Negative for anxiety, depression Endo: Negative for unusual weight change.  Heme: Negative for bruising or bleeding. Allergy: Negative for rash or hives.  Physical Exam: Vital signs in last 24 hours: Temp:  [98.5 F (36.9 C)] 98.5 F (36.9 C) (09/09 0737) Pulse Rate:  [69] 69 (09/09 0737) SpO2:  [96 %] 96 % (09/09 0737) Weight:  [102.5  kg] 102.5 kg (09/09 0737)   General:   Alert,  Well-developed, well-nourished, pleasant and cooperative in NAD Head:  Normocephalic and atraumatic. Eyes:  Sclera clear, no icterus.   Conjunctiva pink. Ears:  Normal auditory acuity. Nose:  No deformity, discharge,  or lesions. Msk:  Symmetrical without gross deformities. Normal posture. Extremities:  Without clubbing or edema. Neurologic:  Alert and  oriented x4;  grossly normal neurologically. Skin:  Intact without significant lesions or rashes. Psych:  Alert and cooperative. Normal mood and affect.   Impression/Plan: RYKIN ROUTE is here for an EGD and colonoscopy to be performed for anemia, rectal bleeding  Risks, benefits, limitations, imponderables and alternatives regarding procedure have been reviewed with the patient. Questions have been answered. All parties agreeable.

## 2023-11-17 NOTE — Anesthesia Preprocedure Evaluation (Addendum)
 Anesthesia Evaluation  Patient identified by MRN, date of birth, ID band Patient awake    Reviewed: Allergy & Precautions, H&P , NPO status , Patient's Chart, lab work & pertinent test results  Airway Mallampati: II  TM Distance: >3 FB Neck ROM: Full    Dental no notable dental hx.    Pulmonary Current Smoker   Pulmonary exam normal breath sounds clear to auscultation       Cardiovascular hypertension, Normal cardiovascular exam Rhythm:Regular Rate:Normal     Neuro/Psych   Anxiety     negative neurological ROS  negative psych ROS   GI/Hepatic negative GI ROS, Neg liver ROS,,,  Endo/Other  diabetes    Renal/GU negative Renal ROS  negative genitourinary   Musculoskeletal negative musculoskeletal ROS (+)    Abdominal   Peds negative pediatric ROS (+)  Hematology  (+) Blood dyscrasia, anemia   Anesthesia Other Findings   Reproductive/Obstetrics negative OB ROS                              Anesthesia Physical Anesthesia Plan  ASA: 2  Anesthesia Plan: General   Post-op Pain Management:    Induction: Intravenous  PONV Risk Score and Plan:   Airway Management Planned: Nasal Cannula  Additional Equipment:   Intra-op Plan:   Post-operative Plan:   Informed Consent: I have reviewed the patients History and Physical, chart, labs and discussed the procedure including the risks, benefits and alternatives for the proposed anesthesia with the patient or authorized representative who has indicated his/her understanding and acceptance.     Dental advisory given  Plan Discussed with: CRNA  Anesthesia Plan Comments:          Anesthesia Quick Evaluation

## 2023-11-17 NOTE — Addendum Note (Signed)
 Addendum  created 11/17/23 1246 by Herschell Hollering, MD   Clinical Note Signed

## 2023-11-17 NOTE — Discharge Instructions (Addendum)
 EGD Discharge instructions Please read the instructions outlined below and refer to this sheet in the next few weeks. These discharge instructions provide you with general information on caring for yourself after you leave the hospital. Your doctor may also give you specific instructions. While your treatment has been planned according to the most current medical practices available, unavoidable complications occasionally occur. If you have any problems or questions after discharge, please call your doctor. ACTIVITY You may resume your regular activity but move at a slower pace for the next 24 hours.  Take frequent rest periods for the next 24 hours.  Walking will help expel (get rid of) the air and reduce the bloated feeling in your abdomen.  No driving for 24 hours (because of the anesthesia (medicine) used during the test).  You may shower.  Do not sign any important legal documents or operate any machinery for 24 hours (because of the anesthesia used during the test).  NUTRITION Drink plenty of fluids.  You may resume your normal diet.  Begin with a light meal and progress to your normal diet.  Avoid alcoholic beverages for 24 hours or as instructed by your caregiver.  MEDICATIONS You may resume your normal medications unless your caregiver tells you otherwise.  WHAT YOU CAN EXPECT TODAY You may experience abdominal discomfort such as a feeling of fullness or "gas" pains.  FOLLOW-UP Your doctor will discuss the results of your test with you.  SEEK IMMEDIATE MEDICAL ATTENTION IF ANY OF THE FOLLOWING OCCUR: Excessive nausea (feeling sick to your stomach) and/or vomiting.  Severe abdominal pain and distention (swelling).  Trouble swallowing.  Temperature over 101 F (37.8 C).  Rectal bleeding or vomiting of blood.     Colonoscopy Discharge Instructions  Read the instructions outlined below and refer to this sheet in the next few weeks. These discharge instructions provide you with  general information on caring for yourself after you leave the hospital. Your doctor may also give you specific instructions. While your treatment has been planned according to the most current medical practices available, unavoidable complications occasionally occur.   ACTIVITY You may resume your regular activity, but move at a slower pace for the next 24 hours.  Take frequent rest periods for the next 24 hours.  Walking will help get rid of the air and reduce the bloated feeling in your belly (abdomen).  No driving for 24 hours (because of the medicine (anesthesia) used during the test).   Do not sign any important legal documents or operate any machinery for 24 hours (because of the anesthesia used during the test).  NUTRITION Drink plenty of fluids.  You may resume your normal diet as instructed by your doctor.  Begin with a light meal and progress to your normal diet. Heavy or fried foods are harder to digest and may make you feel sick to your stomach (nauseated).  Avoid alcoholic beverages for 24 hours or as instructed.  MEDICATIONS You may resume your normal medications unless your doctor tells you otherwise.  WHAT YOU CAN EXPECT TODAY Some feelings of bloating in the abdomen.  Passage of more gas than usual.  Spotting of blood in your stool or on the toilet paper.  IF YOU HAD POLYPS REMOVED DURING THE COLONOSCOPY: No aspirin products for 7 days or as instructed.  No alcohol for 7 days or as instructed.  Eat a soft diet for the next 24 hours.  FINDING OUT THE RESULTS OF YOUR TEST Not all test results are  available during your visit. If your test results are not back during the visit, make an appointment with your caregiver to find out the results. Do not assume everything is normal if you have not heard from your caregiver or the medical facility. It is important for you to follow up on all of your test results.  SEEK IMMEDIATE MEDICAL ATTENTION IF: You have more than a spotting of  blood in your stool.  Your belly is swollen (abdominal distention).  You are nauseated or vomiting.  You have a temperature over 101.  You have abdominal pain or discomfort that is severe or gets worse throughout the day.   Your EGD revealed inflammation in your stomach and small bowel.  I took biopsies of this to rule out infection with a bacteria called H. pylori.  Await pathology results, my office will contact you.  Esophagus was normal.  I am going to start you on a new medication called pantoprazole  daily and I have sent this to your pharmacy.  Your colonoscopy was relatively unremarkable.  I did not find any polyps or evidence of colon cancer.  I recommend repeating colonoscopy in 10 years for colon cancer screening purposes.    Overall, your colon appeared very healthy.  I did not see any active inflammation indicative of underlying inflammatory bowel disease such as Crohn's disease or ulcerative colitis throughout your colon or end portion of your small bowel.   You do have internal hemorrhoids which is likely the cause of your intermittent bleeding.  We can discuss treatment for these if it becomes more of a chronic issue.  Otherwise follow-up in 3 months.  Office will notify you.    I hope you have a great rest of your week!  Carlin POUR. Cindie, D.O. Gastroenterology and Hepatology [facility]

## 2023-11-17 NOTE — Op Note (Signed)
 [facility] Patient Name: Bradley Li Procedure Date: 11/17/2023 8:25 AM MRN: [id number] Date of Birth: 02-02-1986 Attending MD: Carlin POUR. Cindie , [place], [id number] CSN: [id number] Age: 38 Admit Type: Outpatient Procedure:                Upper GI endoscopy Indications:              Anemia Providers:                Carlin POUR. Cindie, DO, Crystal Page, Jon Loge Referring MD:              Medicines:                See the Anesthesia note for documentation of the                            administered medications Complications:            No immediate complications. Estimated Blood Loss:     Estimated blood loss was minimal. Procedure:                Pre-Anesthesia Assessment:                           - The anesthesia plan was to use monitored                            anesthesia care (MAC).                           After obtaining informed consent, the endoscope was                            passed under direct vision. Throughout the                            procedure, the patient's blood pressure, pulse, and                            oxygen saturations were monitored continuously. The                            [id number]-[id number] ([id number]) Upper was introduced through                            the mouth, and advanced to the second part of                            duodenum. The upper GI endoscopy was accomplished                            without difficulty. The patient tolerated the                            procedure well. Scope In: 8:39:09 AM Scope Out: 8:42:39  AM Total Procedure Duration: 0 hours 3 minutes 30 seconds  Findings:      The examined esophagus was normal.      Patchy moderate inflammation characterized by erosions and erythema was       found in the gastric body and in the gastric antrum. Biopsies were taken       with a cold forceps for Helicobacter pylori testing.      Localized mild inflammation characterized by  erosions and erythema was       found in the duodenal bulb. Biopsies were taken with a cold forceps for       histology.      The second portion of the duodenum was normal. Impression:               - Normal esophagus.                           - Gastritis. Biopsied.                           - Duodenitis. Biopsied.                           - Normal second portion of the duodenum. Moderate Sedation:      Per Anesthesia Care Recommendation:           - Patient has a contact number available for                            emergencies. The signs and symptoms of potential                            delayed complications were discussed with the                            patient. Return to normal activities tomorrow.                            Written discharge instructions were provided to the                            patient.                           - Resume previous diet.                           - Continue present medications.                           - Await pathology results.                           - Use Protonix  (pantoprazole ) 40 mg PO daily.                           - Return to GI clinic in 3 months. Procedure Code(s):        --- Professional ---  56760, Esophagogastroduodenoscopy, flexible,                            transoral; with biopsy, single or multiple Diagnosis Code(s):        --- Professional ---                           K29.70, Gastritis, unspecified, without bleeding                           K29.80, Duodenitis without bleeding CPT copyright 2022 [employer]. All rights reserved. The codes documented in this report are preliminary and upon coder review may  be revised to meet current compliance requirements. Carlin POUR. Cindie, DO Carlin POUR. Eligha Kmetz, DO 11/17/2023 8:47:10 AM This report has been signed electronically. Number of Addenda: 0

## 2023-11-18 ENCOUNTER — Encounter (HOSPITAL_COMMUNITY): Payer: Self-pay | Admitting: Internal Medicine

## 2023-11-18 LAB — SURGICAL PATHOLOGY

## 2023-11-19 ENCOUNTER — Ambulatory Visit: Payer: Self-pay | Admitting: Internal Medicine

## 2024-02-16 ENCOUNTER — Ambulatory Visit: Payer: PRIVATE HEALTH INSURANCE | Admitting: Gastroenterology

## 2024-03-30 ENCOUNTER — Ambulatory Visit: Payer: PRIVATE HEALTH INSURANCE | Admitting: Gastroenterology
